# Patient Record
Sex: Male | Born: 1975
Health system: Southern US, Community
[De-identification: ages and names within clinical notes are randomized; demographics above are authoritative.]

## PROBLEM LIST (undated history)

## (undated) ENCOUNTER — Emergency Department (HOSPITAL_COMMUNITY): Payer: Self-pay | Source: Home / Self Care

## (undated) DIAGNOSIS — I38 Endocarditis, valve unspecified: Secondary | ICD-10-CM

## (undated) DIAGNOSIS — R011 Cardiac murmur, unspecified: Secondary | ICD-10-CM

## (undated) HISTORY — PX: BACK SURGERY: SHX140

---

## 2002-11-13 ENCOUNTER — Emergency Department (HOSPITAL_COMMUNITY): Admission: EM | Admit: 2002-11-13 | Discharge: 2002-11-13 | Payer: Self-pay | Admitting: *Deleted

## 2003-11-18 ENCOUNTER — Emergency Department (HOSPITAL_COMMUNITY): Admission: EM | Admit: 2003-11-18 | Discharge: 2003-11-18 | Payer: Self-pay | Admitting: Emergency Medicine

## 2011-07-06 ENCOUNTER — Emergency Department (HOSPITAL_BASED_OUTPATIENT_CLINIC_OR_DEPARTMENT_OTHER)
Admission: EM | Admit: 2011-07-06 | Discharge: 2011-07-06 | Disposition: A | Discharge status: Home / Self Care | Payer: Self-pay | Attending: Emergency Medicine | Admitting: Emergency Medicine

## 2011-07-06 DIAGNOSIS — F17200 Nicotine dependence, unspecified, uncomplicated: Secondary | ICD-10-CM | POA: Insufficient documentation

## 2011-07-06 DIAGNOSIS — H109 Unspecified conjunctivitis: Secondary | ICD-10-CM | POA: Insufficient documentation

## 2011-07-06 DIAGNOSIS — H5712 Ocular pain, left eye: Secondary | ICD-10-CM

## 2011-07-06 DIAGNOSIS — H5710 Ocular pain, unspecified eye: Secondary | ICD-10-CM | POA: Insufficient documentation

## 2011-07-06 HISTORY — DX: Endocarditis, valve unspecified: I38

## 2011-07-06 MED ORDER — TETRACAINE HCL 0.5 % OP SOLN
OPHTHALMIC | Status: AC
  Administered 2011-07-06: 2 [drp] via OPHTHALMIC
  Filled 2011-07-06: qty 2

## 2011-07-06 MED ORDER — FLUORESCEIN SODIUM 1 MG OP STRP
1.0000 | ORAL_STRIP | Freq: Once | OPHTHALMIC | Status: AC
  Administered 2011-07-06: 1 via OPHTHALMIC

## 2011-07-06 MED ORDER — HYDROCODONE-ACETAMINOPHEN 5-500 MG PO TABS
1.0000 | ORAL_TABLET | Freq: Four times a day (QID) | ORAL | Status: DC | PRN
Start: 2011-07-06 — End: 2011-07-16

## 2011-07-06 MED ORDER — TETRACAINE HCL 0.5 % OP SOLN
1.0000 [drp] | Freq: Once | OPHTHALMIC | Status: AC
  Administered 2011-07-06: 2 [drp] via OPHTHALMIC

## 2011-07-06 MED ORDER — ERYTHROMYCIN 5 MG/GM OP OINT
TOPICAL_OINTMENT | OPHTHALMIC | Status: AC
  Administered 2011-07-06: 1 via OPHTHALMIC
  Filled 2011-07-06: qty 3.5

## 2011-07-06 MED ORDER — ERYTHROMYCIN 5 MG/GM OP OINT
TOPICAL_OINTMENT | Freq: Four times a day (QID) | OPHTHALMIC | Status: DC
  Administered 2011-07-06: 1 via OPHTHALMIC

## 2011-07-06 MED ORDER — FLUORESCEIN SODIUM 1 MG OP STRP
ORAL_STRIP | OPHTHALMIC | Status: AC
  Administered 2011-07-06: 1 via OPHTHALMIC
  Filled 2011-07-06: qty 1

## 2011-07-06 NOTE — ED Notes (Signed)
Pt states that his L eye has been very red, burning, and irritated.  Unaware of any activity that may have injured his eye.  No drainage noted.

## 2011-07-06 NOTE — ED Provider Notes (Signed)
History     CSN: [id number] Arrival date & time: 07/06/2011  1:51 AM   First MD Initiated Contact with Patient 07/06/11 0209      Chief Complaint  Patient presents with  . Eye Pain    (Consider location/radiation/quality/duration/timing/severity/associated sxs/prior treatment) Patient is a 35 y.o. male presenting with eye pain. The history is provided by the patient.  Eye Pain This is a new problem. Episode onset: 4 days ago. The problem occurs constantly. The problem has been gradually worsening. Pertinent negatives include no chest pain, no abdominal pain, no headaches and no shortness of breath. Exacerbated by: Sherlynn Stalls makes it worse. The symptoms are relieved by nothing. Treatments tried: Over-the-counter saline drops with minimal relief. Also taking ibuprofen with mild relief.   location left eye. does not wear contacts. No known foreign body and has some foreign body sensation. Pain is sharp and there is associated redness to the eye. No change in vision. 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 No fevers or chills. Has some associated left-sided headache. No pain with movement of the eye.  Past Medical History  Diagnosis Date  . Heart valve disorder     History reviewed. No pertinent past surgical history.  History reviewed. No pertinent family history.  History  Substance Use Topics  . Smoking status: Current Everyday Smoker -- 0.5 packs/day for 23 years    Types: Cigarettes  . Smokeless tobacco: Not on file  . Alcohol Use: No      Review of Systems  Constitutional: Negative for fever and chills.  HENT: Negative for ear pain, sore throat, rhinorrhea, neck pain and neck stiffness.   Eyes: Positive for pain and redness. Negative for itching and visual disturbance.       Some clear drainage. No thick discharge  Respiratory: Negative for cough and shortness of breath.   Cardiovascular: Negative for chest pain.  Gastrointestinal: Negative for abdominal pain.    Genitourinary: Negative for dysuria.  Musculoskeletal: Negative for back pain.  Skin: Negative for rash.  Neurological: Negative for headaches.  All other systems reviewed and are negative.    Allergies  Lactose intolerance (gi)  Home Medications   Current Outpatient Rx  Name Route Sig Dispense Refill  . EYE IRRIGATING OP SOLN Left Eye Place 1 drop into the left eye as needed.      Marland Kitchen HYDROCODONE-ACETAMINOPHEN 5-500 MG PO TABS Oral Take 1-2 tablets by mouth every 6 (six) hours as needed for pain. 15 tablet 0    BP 111/71  Pulse 66  Temp(Src) 97.9 F (36.6 C) (Oral)  Resp 17  Ht 5\' 8"  (1.727 m)  Wt 180 lb (81.647 kg)  BMI 27.37 kg/m2  SpO2 97%  Physical Exam  Constitutional: He is oriented to person, place, and time. He appears well-developed and well-nourished.  HENT:  Head: Normocephalic and atraumatic.  Eyes: EOM are normal. Pupils are equal, round, and reactive to light. Right eye exhibits no discharge. Left eye exhibits no discharge. No scleral icterus.       Right eye no abnormalities. Pupil reactive and no pain in left eye when shining light in the right eye. Left eye conjunctiva injected. Photophobia present. I palpated mild tenderness but is soft . No periorbital swelling or erythema. No mid-fixed dilated pupil, is appropriatly reactive to light.   Neck: Trachea normal. Neck supple. No thyromegaly present.  Cardiovascular: Normal rate, regular rhythm, S1 normal, S2 normal and normal pulses.     No systolic murmur is  present   No diastolic murmur is present  Pulses:      Radial pulses are 2+ on the right side, and 2+ on the left side.  Pulmonary/Chest: Effort normal and breath sounds normal. He has no wheezes. He has no rhonchi. He has no rales. He exhibits no tenderness.  Abdominal: Soft. Normal appearance and bowel sounds are normal. There is no tenderness. There is no CVA tenderness and negative Murphy's sign.  Neurological: He is alert and oriented to person,  place, and time. He has normal strength. No cranial nerve deficit or sensory deficit. GCS eye subscore is 4. GCS verbal subscore is 5. GCS motor subscore is 6.  Skin: Skin is warm and dry. No rash noted. He is not diaphoretic.  Psychiatric: His speech is normal.       Cooperative and appropriate    ED Course  Procedures (including critical care time)  Left eye Wood's lamp and slit-lamp and tonometry exam Intraocular pressure measured 10 by Tono-pen. Tetracaine and fluorescein applied with relief of pain. No dye uptake. No ulceration present. No dendritic pattern. No foreign body seen. VAs: L eye 20/35 and R eye 20/25  1. Conjunctivitis   2. Left eye pain       MDM   Left eye pain and photophobia with conjunctival injection. Pain improved with tetracaine. No foreign body seen. Erythromycin ointment provided. Prescription for hydrocodone as needed. Given severity of pain the initial valuation and progressive symptoms, I recommended the patient seen ophthalmologist today in clinic. There is no glaucoma with normal tonometry. Symptoms improved with no vision deficits and stable for discharge at this time.        Sunnie Nielsen, MD 07/06/11 [phone]

## 2011-07-16 ENCOUNTER — Encounter (HOSPITAL_BASED_OUTPATIENT_CLINIC_OR_DEPARTMENT_OTHER): Payer: Self-pay

## 2011-07-16 ENCOUNTER — Emergency Department (HOSPITAL_BASED_OUTPATIENT_CLINIC_OR_DEPARTMENT_OTHER)
Admission: EM | Admit: 2011-07-16 | Discharge: 2011-07-16 | Disposition: A | Discharge status: Home / Self Care | Payer: Self-pay | Attending: Emergency Medicine | Admitting: Emergency Medicine

## 2011-07-16 DIAGNOSIS — S0500XA Injury of conjunctiva and corneal abrasion without foreign body, unspecified eye, initial encounter: Secondary | ICD-10-CM

## 2011-07-16 DIAGNOSIS — F17200 Nicotine dependence, unspecified, uncomplicated: Secondary | ICD-10-CM | POA: Insufficient documentation

## 2011-07-16 DIAGNOSIS — X58XXXA Exposure to other specified factors, initial encounter: Secondary | ICD-10-CM | POA: Insufficient documentation

## 2011-07-16 DIAGNOSIS — H5710 Ocular pain, unspecified eye: Secondary | ICD-10-CM | POA: Insufficient documentation

## 2011-07-16 DIAGNOSIS — S058X9A Other injuries of unspecified eye and orbit, initial encounter: Secondary | ICD-10-CM | POA: Insufficient documentation

## 2011-07-16 MED ORDER — FLUORESCEIN SODIUM 1 MG OP STRP
ORAL_STRIP | OPHTHALMIC | Status: AC
  Administered 2011-07-16: 16:00:00
  Filled 2011-07-16: qty 1

## 2011-07-16 MED ORDER — HYDROCODONE-ACETAMINOPHEN 5-500 MG PO TABS: 1.0000 | ORAL_TABLET | Freq: Four times a day (QID) | ORAL | Status: AC | PRN

## 2011-07-16 MED ORDER — CIPROFLOXACIN HCL 0.3 % OP SOLN
OPHTHALMIC | Status: AC
  Filled 2011-07-16: qty 2.5

## 2011-07-16 MED ORDER — CIPROFLOXACIN HCL 0.3 % OP SOLN
2.0000 [drp] | OPHTHALMIC | Status: DC
  Administered 2011-07-16: 2 [drp] via OPHTHALMIC

## 2011-07-16 MED ORDER — TETRACAINE HCL 0.5 % OP SOLN
OPHTHALMIC | Status: AC
  Administered 2011-07-16: 16:00:00
  Filled 2011-07-16: qty 2

## 2011-07-16 NOTE — ED Provider Notes (Signed)
History     CSN: [id number]  Arrival date & time 07/16/11  1413   First MD Initiated Contact with Patient 07/16/11 1528      Chief Complaint  Patient presents with  . Eye Pain    (Consider location/radiation/quality/duration/timing/severity/associated sxs/prior treatment) Patient is a 35 y.o. male presenting with eye pain. The history is provided by the patient.  Eye Pain This is a recurrent problem. The current episode started more than 1 week ago. The problem occurs constantly. The problem has not changed since onset.Pertinent negatives include no chest pain and no headaches.   patient's had left eye pain for last couple weeks. Some redness. He was seen here just over a week ago for the same. He is given a prescription for the symptoms are not improved. He states that he did not followup with ophthalmology, he came back here to see if there is something in there. He states he has trouble seeing out of that eye. He states that has gotten worse. No fevers. No trauma. No headache. He states the C-spine on the other eye.  Past Medical History  Diagnosis Date  . Heart valve disorder     History reviewed. No pertinent past surgical history.  History reviewed. No pertinent family history.  History  Substance Use Topics  . Smoking status: Current Everyday Smoker -- 0.5 packs/day for 23 years    Types: Cigarettes  . Smokeless tobacco: Not on file  . Alcohol Use: No      Review of Systems  Constitutional: Negative for chills and fatigue.  Eyes: Positive for photophobia, pain, discharge, redness and visual disturbance. Negative for itching.  Respiratory: Negative for cough.   Cardiovascular: Negative for chest pain.  Neurological: Negative for headaches.    Allergies  Lactose intolerance (gi)  Home Medications   Current Outpatient Rx  Name Route Sig Dispense Refill  . EYE IRRIGATING OP SOLN Left Eye Place 1 drop into the left eye as needed.      Marland Kitchen  HYDROCODONE-ACETAMINOPHEN 5-500 MG PO TABS Oral Take 1-2 tablets by mouth every 6 (six) hours as needed for pain. 15 tablet 0    BP 121/75  Pulse 80  Temp(Src) 98.7 F (37.1 C) (Oral)  Resp 16  Ht 5\' 8"  (1.727 m)  Wt 180 lb (81.647 kg)  BMI 27.37 kg/m2  SpO2 100%  Physical Exam  Constitutional: He appears well-developed and well-nourished.  HENT:  Head: Normocephalic.       Left eye conjunctiva injected. No real drainage. Cornea slightly cloudy. Intraocular pressure 14. Under dyed examination has uptake inferiorly with some linear aspects. No foreign body seen. Some photophobia.  Neck: Normal range of motion.    ED Course  Procedures (including critical care time)  Labs Reviewed - No data to display No results found.   1. Corneal abrasion       MDM  Patient's had eye pain the last couple weeks. Redness. Seen in ER couple days ago for the same he never followed with ophthalmology. He has fluorescin uptake inferiorly. He'll follow with Dr. Delaney Meigs tomorrow 1 PM he was given antibiotic drops.        Juliet Rude. Rubin Payor, MD 07/16/11 [phone]

## 2011-07-16 NOTE — ED Notes (Signed)
MD at bedside. 

## 2011-07-16 NOTE — ED Notes (Signed)
Pt c/o L eye pain for past 2.5 weeks.  Pt has redness.  Pt seen here 12/17 for same. Given Rx for abrasion but SX are worsening.

## 2015-11-25 ENCOUNTER — Emergency Department (HOSPITAL_BASED_OUTPATIENT_CLINIC_OR_DEPARTMENT_OTHER): Payer: Self-pay

## 2015-11-25 ENCOUNTER — Encounter (HOSPITAL_BASED_OUTPATIENT_CLINIC_OR_DEPARTMENT_OTHER): Payer: Self-pay | Admitting: *Deleted

## 2015-11-25 ENCOUNTER — Emergency Department (HOSPITAL_BASED_OUTPATIENT_CLINIC_OR_DEPARTMENT_OTHER)
Admission: EM | Admit: 2015-11-25 | Discharge: 2015-11-25 | Disposition: A | Discharge status: Home / Self Care | Payer: Self-pay | Attending: Emergency Medicine | Admitting: Emergency Medicine

## 2015-11-25 DIAGNOSIS — N50812 Left testicular pain: Secondary | ICD-10-CM | POA: Insufficient documentation

## 2015-11-25 DIAGNOSIS — Z87891 Personal history of nicotine dependence: Secondary | ICD-10-CM | POA: Insufficient documentation

## 2015-11-25 DIAGNOSIS — N50819 Testicular pain, unspecified: Secondary | ICD-10-CM

## 2015-11-25 LAB — URINALYSIS, ROUTINE W REFLEX MICROSCOPIC
Glucose, UA: NEGATIVE mg/dL
Hgb urine dipstick: NEGATIVE
Ketones, ur: NEGATIVE mg/dL
LEUKOCYTES UA: NEGATIVE
NITRITE: NEGATIVE
PH: 6 (ref 5.0–8.0)
Protein, ur: NEGATIVE mg/dL
SPECIFIC GRAVITY, URINE: 1.035 — AB (ref 1.005–1.030)

## 2015-11-25 NOTE — ED Provider Notes (Signed)
CSN: [id number]     Arrival date & time 11/25/15  1711 History  By signing my name below, I, Linus Galas, attest that this documentation has been prepared under the direction and in the presence of Geoffery Lyons, MD. Electronically Signed: Linus Galas, ED Scribe. 11/25/2015. 5:56 PM.   Chief Complaint  Patient presents with  . Groin Injury   The history is provided by the patient. No language interpreter was used.   HPI Comments: Cameron Johns is a 40 y.o. male who presents to the Emergency Department complaining of groin pain that began today. Pt states he was getting out of bed yesterday when he stepped on a bag of laundry causing him to do a split.  Pt states he did not have pain immediately afterwards however reports he began having excruciating pain since 4 AM this morning. Since then, he notes his left testicle is swollen and hanging lower that the right testicle. He also reports shooting pain down his left leg. Pt denies any vomiting, hematuria, or any other symptoms at this time.   Past Medical History  Diagnosis Date  . Heart valve disorder    History reviewed. No pertinent past surgical history. No family history on file. Social History  Substance Use Topics  . Smoking status: Former Smoker -- 0.50 packs/day for 23 years    Types: Cigarettes  . Smokeless tobacco: None  . Alcohol Use: No    Review of Systems A complete 10 system review of systems was obtained and all systems are negative except as noted in the HPI and PMH.   Allergies  Lactose intolerance (gi)  Home Medications   Prior to Admission medications   Medication Sig Start Date End Date Taking? Authorizing Provider  ophthalmic irrigation solution ophthalmic solution Place 1 drop into the left eye as needed.      Historical Provider, MD   BP 122/87 mmHg  Pulse 84  Temp(Src) 98.3 F (36.8 C) (Oral)  Resp 18  Ht  (1.727 m)  Wt 200 lb (90.719 kg)  BMI 30.42 kg/m2  SpO2 99%   Physical Exam   Constitutional: He is oriented to person, place, and time. He appears well-developed and well-nourished.  HENT:  Head: Normocephalic and atraumatic.  Cardiovascular: Normal rate.   Pulmonary/Chest: Effort normal.  Genitourinary:  Left testicle appears grossly normal. TTP and the testicle is freely mobile within the scrotum. No palpable inguinal hernia.   Neurological: He is alert and oriented to person, place, and time.  Skin: Skin is warm and dry.  Psychiatric: He has a normal mood and affect.  Nursing note and vitals reviewed.   ED Course  Procedures  DIAGNOSTIC STUDIES: Oxygen Saturation is 99% on room air, normal by my interpretation.    COORDINATION OF CARE: 5:50 PM Will order US scrotum and urinalysis. Discussed treatment plan with pt at bedside and pt agreed to plan.  Labs Review Labs Reviewed  URINALYSIS, ROUTINE W REFLEX MICROSCOPIC (NOT AT [facility])    Imaging Review No results found. I have personally reviewed and evaluated these images and lab results as part of my medical decision-making.    MDM   Final diagnoses:  None    Patient presents with complaints of pain in his testicle. He states that he slipped and fell yesterday and has been hurting since. His physical examination is unremarkable. He does not examine like a torsion and his ultrasound reveals good blood flow. There are no alternate diagnosis suggested by the ultrasound.  He will be discharged with anti-inflammatories, rest, and when necessary return.  I personally performed the services described in this documentation, which was scribed in my presence. The recorded information has been reviewed and is accurate.       Geoffery Lyonsouglas Laddie Naeem, MD 11/25/15 2108

## 2015-11-25 NOTE — ED Notes (Signed)
Asked Pt. For urine sample and pt. Reports he is unable to urinate at this time.

## 2015-11-25 NOTE — Discharge Instructions (Signed)
Ibuprofen 600 mg every 6 hours as needed for pain.  Follow-up with your primary Dr. if not improving in the next 3-4 days, and return to the ER if symptoms significantly worsen or change.

## 2015-11-25 NOTE — ED Notes (Addendum)
He slipped while walking last night. Woke this am with pain in his left groin. Swelling to his testicle.

## 2015-11-25 NOTE — ED Notes (Signed)
Pt. Reports he has no trouble urinating and no difficulty feeling sensation in his mid section.  Pt. Does report pain in the L upper leg and L testicle

## 2017-03-10 ENCOUNTER — Encounter (HOSPITAL_BASED_OUTPATIENT_CLINIC_OR_DEPARTMENT_OTHER): Payer: Self-pay

## 2017-03-10 ENCOUNTER — Emergency Department (HOSPITAL_BASED_OUTPATIENT_CLINIC_OR_DEPARTMENT_OTHER)
Admission: EM | Admit: 2017-03-10 | Discharge: 2017-03-10 | Disposition: A | Discharge status: Home / Self Care | Payer: 59 | Attending: Emergency Medicine | Admitting: Emergency Medicine

## 2017-03-10 ENCOUNTER — Emergency Department (HOSPITAL_BASED_OUTPATIENT_CLINIC_OR_DEPARTMENT_OTHER): Payer: 59

## 2017-03-10 DIAGNOSIS — R0789 Other chest pain: Secondary | ICD-10-CM | POA: Insufficient documentation

## 2017-03-10 DIAGNOSIS — F17210 Nicotine dependence, cigarettes, uncomplicated: Secondary | ICD-10-CM | POA: Diagnosis not present

## 2017-03-10 DIAGNOSIS — R079 Chest pain, unspecified: Secondary | ICD-10-CM | POA: Diagnosis present

## 2017-03-10 HISTORY — DX: Cardiac murmur, unspecified: R01.1

## 2017-03-10 LAB — CBC
HEMATOCRIT: 46 % (ref 39.0–52.0)
Hemoglobin: 16.3 g/dL (ref 13.0–17.0)
MCH: 31.5 pg (ref 26.0–34.0)
MCHC: 35.4 g/dL (ref 30.0–36.0)
MCV: 88.8 fL (ref 78.0–100.0)
PLATELETS: 212 10*3/uL (ref 150–400)
RBC: 5.18 MIL/uL (ref 4.22–5.81)
RDW: 13.3 % (ref 11.5–15.5)
WBC: 7.3 10*3/uL (ref 4.0–10.5)

## 2017-03-10 LAB — BASIC METABOLIC PANEL
Anion gap: 9 (ref 5–15)
BUN: 18 mg/dL (ref 6–20)
CO2: 25 mmol/L (ref 22–32)
Calcium: 9.3 mg/dL (ref 8.9–10.3)
Chloride: 103 mmol/L (ref 101–111)
Creatinine, Ser: 1.03 mg/dL (ref 0.61–1.24)
Glucose, Bld: 97 mg/dL (ref 65–99)
POTASSIUM: 4 mmol/L (ref 3.5–5.1)
SODIUM: 137 mmol/L (ref 135–145)

## 2017-03-10 LAB — TROPONIN I: Troponin I: <0.03 ng/mL (ref <0.03)

## 2017-03-10 NOTE — ED Notes (Signed)
Patient transported to X-ray 

## 2017-03-10 NOTE — Discharge Instructions (Signed)
Follow up with a pcp.  Try to quit smoking

## 2017-03-10 NOTE — ED Provider Notes (Signed)
[facility] Provider Note   CSN: [id number] Arrival date & time: 03/10/17  1202     History   Chief Complaint Chief Complaint  Patient presents with  . Chest Pain    HPI Cameron Johns is a 41 y.o. male.  41 yo M with a chief complaint of chest pain. This been an ongoing issue for him. The patient has had episodes of sharp shooting chest pain just underneath the nipple on the left side. Usually last for seconds sometimes is associated with a syncopal event. Mostly occur when he is under increased stress. Denies cough or congestion denies fevers or chills. Denies lower extremity edema. Denies hemoptysis. Denies prior PE or DVT. Denies recent travel or surgery. States that his initial episode occurred when he was divorcing his first wife. His sister told him that he had had a heart attack based clinically on his symptoms. He never saw a physician. Since then he has continued to have symptoms like this off and on sometimes associated with a syncopal event. He has had one exertional syncopal event that occurred about 6 months ago. Otherwise they typically occur at rest. He had a sharp chest pain yesterday with some shortness of breath that lasted for about a minute and resolved. his girlfriend talked him into coming to the ED.   The history is provided by the patient.  Chest Pain   This is a new problem. The current episode started more than 1 week ago. The problem occurs constantly. The problem has not changed since onset.The pain is present in the lateral region. The pain is at a severity of 10/10. The pain is severe. The quality of the pain is described as sharp. The pain does not radiate. Duration of episode(s) is 48 months. Associated symptoms include shortness of breath. Pertinent negatives include no abdominal pain, no fever, no headaches, no palpitations and no vomiting. He has tried nothing for the symptoms. The treatment provided no relief. Risk factors include  smoking/tobacco exposure.  Pertinent negatives for past medical history include no DVT, no MI and no PVD.  Pertinent negatives for family medical history include: no early MI and no PE.  Procedure history is negative for cardiac catheterization.    Past Medical History:  Diagnosis Date  . Heart murmur   . Heart valve disorder     There are no active problems to display for this patient.   History reviewed. No pertinent surgical history.     Home Medications    Prior to Admission medications   Not on File    Family History No family history on file.  Social History Social History  Substance Use Topics  . Smoking status: Current Every Day Smoker    Packs/day: 0.50    Years: 23.00    Types: Cigarettes  . Smokeless tobacco: Never Used  . Alcohol use Yes     Comment: occ     Allergies   Lactose intolerance (gi)   Review of Systems Review of Systems  Constitutional: Negative for chills and fever.  HENT: Negative for congestion and facial swelling.   Eyes: Negative for discharge and visual disturbance.  Respiratory: Positive for shortness of breath.   Cardiovascular: Positive for chest pain. Negative for palpitations.  Gastrointestinal: Negative for abdominal pain, diarrhea and vomiting.  Musculoskeletal: Negative for arthralgias and myalgias.  Skin: Negative for color change and rash.  Neurological: Positive for syncope. Negative for tremors and headaches.  Psychiatric/Behavioral: Negative for confusion and dysphoric mood.  Physical Exam Updated Vital Signs BP (!) 126/95 (BP Location: Left Arm)   Pulse 74   Temp 98.1 F (36.7 C) (Oral)   Resp 18   Ht 5\' 6"  (1.676 m)   Wt 80.6 kg (177 lb 11.1 oz)   SpO2 97%   BMI 28.68 kg/m   Physical Exam  Constitutional: He is oriented to person, place, and time. He appears well-developed and well-nourished.  HENT:  Head: Normocephalic and atraumatic.  Eyes: Pupils are equal, round, and reactive to light.  EOM are normal.  Neck: Normal range of motion. Neck supple. No JVD present.  Cardiovascular: Normal rate and regular rhythm.  Exam reveals no gallop and no friction rub.   No murmur heard. Pulmonary/Chest: No respiratory distress. He has no wheezes. He exhibits tenderness (pinpoint chest underneath the left nipple. Reproduces his pain.Marland Kitchen).  Abdominal: He exhibits no distension and no mass. There is no tenderness. There is no rebound and no guarding.  Musculoskeletal: Normal range of motion.  Neurological: He is alert and oriented to person, place, and time.  Skin: No rash noted. No pallor.  Psychiatric: He has a normal mood and affect. His behavior is normal.  Nursing note and vitals reviewed.    ED Treatments / Results  Labs (all labs ordered are listed, but only abnormal results are displayed) Labs Reviewed  BASIC METABOLIC PANEL  CBC  TROPONIN I    EKG  EKG Interpretation  Date/Time:  Wednesday March 10 2017 12:09:07 EDT Ventricular Rate:  77 PR Interval:  142 QRS Duration: 86 QT Interval:  368 QTC Calculation: 416 R Axis:   79 Text Interpretation:  Normal sinus rhythm Early repolarization Normal ECG ? peaked t waves though more likely BER with diffuse lead involvement Otherwise no significant change Confirmed by Melene Plan [phone]) on 03/10/2017 12:22:17 PM       Radiology Dg Chest 2 View  Result Date: 03/10/2017 CLINICAL DATA:  Intermittent chest pain with multiple syncopal episodes over the past year. Chest pain became worse yesterday. History of heart murmur, current smoker. EXAM: CHEST  2 VIEW COMPARISON:  Report of a chest x-ray dated January 10, 2000. FINDINGS: The lungs are well-expanded. The interstitial markings are coarse. There is no alveolar infiltrate, pleural effusion, or pneumothorax. The heart and pulmonary vascularity are normal. The mediastinum is normal in width. The bony thorax exhibits no acute abnormality. IMPRESSION: Mild bronchitic-smoking related  interstitial changes. No pneumonia, CHF, nor other acute cardiopulmonary abnormality. Electronically Signed   By: David  [place] M.D.   On: 03/10/2017 12:52    Procedures Procedures (including critical care time)  Medications Ordered in ED Medications - No data to display   Initial Impression / Assessment and Plan / ED Course  I have reviewed the triage vital signs and the nursing notes.  Pertinent labs & imaging results that were available during my care of the patient were reviewed by me and considered in my medical decision making (see chart for details).     41 yo M With a chief complaint of atypical chest pain. Reproduced on exam. He also has had multiple syncopal events in the past though they sound mostly vasovagal in nature. He had chest pain transiently yesterday. No pain today. Workup ordered in triage. I feel completely atypical for ACS. He does have a smoking history and I discussed him trying to quit.  Workup negative. Discharge home.  Discussed smoking cessation with patient and was they were offerred resources to help stop.  Total  time was 5 min CPT code 16109.   1:26 PM:  I have discussed the diagnosis/risks/treatment options with the patient and believe the pt to be eligible for discharge home to follow-up with PCP. We also discussed returning to the ED immediately if new or worsening sx occur. We discussed the sx which are most concerning (e.g., sudden worsening pain, fever, inability to tolerate by mouth) that necessitate immediate return. Medications administered to the patient during their visit and any new prescriptions provided to the patient are listed below.  Medications given during this visit Medications - No data to display   The patient appears reasonably screen and/or stabilized for discharge and I doubt any other medical condition or other [facility] requiring further screening, evaluation, or treatment in the ED at this time prior to discharge.    Final  Clinical Impressions(s) / ED Diagnoses   Final diagnoses:  Chest wall pain    New Prescriptions New Prescriptions   No medications on file     Melene Plan, DO 03/10/17 1326

## 2017-03-10 NOTE — ED Triage Notes (Signed)
C/o intermittent CP with multiple syncopal episodes x 1 year-NAD-steady gait-pt was seen at [facility] and sent to ED

## 2017-03-10 NOTE — ED Notes (Signed)
ED Provider at bedside. 

## 2017-08-17 ENCOUNTER — Emergency Department (HOSPITAL_BASED_OUTPATIENT_CLINIC_OR_DEPARTMENT_OTHER): Payer: 59

## 2017-08-17 ENCOUNTER — Emergency Department (HOSPITAL_BASED_OUTPATIENT_CLINIC_OR_DEPARTMENT_OTHER)
Admission: EM | Admit: 2017-08-17 | Discharge: 2017-08-18 | Disposition: A | Discharge status: Home / Self Care | Payer: 59 | Attending: Emergency Medicine | Admitting: Emergency Medicine

## 2017-08-17 ENCOUNTER — Other Ambulatory Visit: Payer: Self-pay

## 2017-08-17 ENCOUNTER — Encounter (HOSPITAL_BASED_OUTPATIENT_CLINIC_OR_DEPARTMENT_OTHER): Payer: Self-pay | Admitting: *Deleted

## 2017-08-17 DIAGNOSIS — F17210 Nicotine dependence, cigarettes, uncomplicated: Secondary | ICD-10-CM | POA: Insufficient documentation

## 2017-08-17 DIAGNOSIS — Y9301 Activity, walking, marching and hiking: Secondary | ICD-10-CM | POA: Diagnosis not present

## 2017-08-17 DIAGNOSIS — Y999 Unspecified external cause status: Secondary | ICD-10-CM | POA: Insufficient documentation

## 2017-08-17 DIAGNOSIS — W0110XA Fall on same level from slipping, tripping and stumbling with subsequent striking against unspecified object, initial encounter: Secondary | ICD-10-CM | POA: Diagnosis not present

## 2017-08-17 DIAGNOSIS — M62830 Muscle spasm of back: Secondary | ICD-10-CM | POA: Diagnosis not present

## 2017-08-17 DIAGNOSIS — W19XXXA Unspecified fall, initial encounter: Secondary | ICD-10-CM

## 2017-08-17 DIAGNOSIS — M25561 Pain in right knee: Secondary | ICD-10-CM | POA: Insufficient documentation

## 2017-08-17 DIAGNOSIS — Y929 Unspecified place or not applicable: Secondary | ICD-10-CM | POA: Insufficient documentation

## 2017-08-17 DIAGNOSIS — M62838 Other muscle spasm: Secondary | ICD-10-CM

## 2017-08-17 MED ORDER — IBUPROFEN 400 MG PO TABS
400.0000 mg | ORAL_TABLET | Freq: Once | ORAL | Status: AC
Start: 2017-08-17 — End: 2017-08-17
  Administered 2017-08-17: 400 mg via ORAL
  Filled 2017-08-17: qty 1

## 2017-08-17 MED ORDER — OXYCODONE-ACETAMINOPHEN 5-325 MG PO TABS: 1.0000 | ORAL_TABLET | ORAL | 0 refills | Status: AC | PRN

## 2017-08-17 MED ORDER — CYCLOBENZAPRINE HCL 10 MG PO TABS: 10.0000 mg | ORAL_TABLET | Freq: Two times a day (BID) | ORAL | 0 refills | Status: AC | PRN

## 2017-08-17 MED ORDER — OXYCODONE-ACETAMINOPHEN 5-325 MG PO TABS
1.0000 | ORAL_TABLET | Freq: Once | ORAL | Status: AC
  Administered 2017-08-17: 1 via ORAL
  Filled 2017-08-17: qty 1

## 2017-08-17 NOTE — ED Notes (Signed)
Pt denies pain radiating down back. Denies incontinence or lower extremity weakness.

## 2017-08-17 NOTE — ED Triage Notes (Signed)
Fall tonight when his 100 pound dog pulled him down. Injury to his lower back and right knee.

## 2017-08-17 NOTE — Discharge Instructions (Signed)
Your x-rays today showed no evidence of acute fracture in your knee.  You may have soft tissue or ligamentous injuries that you need to follow-up with orthopedics to further evaluate.  Your low back pain is likely due to muscle spasm.  Your x-rays were reassuring of your back.  Please follow-up with the orthopedist and use the pain and muscle relaxant medicines to help with your symptoms.  Please use the crutches.  If any symptoms change or worsen, please return to the nearest emergency department.

## 2017-08-17 NOTE — ED Provider Notes (Signed)
[facility] EMERGENCY DEPARTMENT Provider Note   CSN: [id number] Arrival date & time: 08/17/17  2007     History   Chief Complaint Chief Complaint  Patient presents with  . Fall    HPI Cameron Johns is a 42 y.o. male.  The history is provided by the patient and medical records.  Fall  This is a new problem. The current episode started 3 to 5 hours ago. The problem occurs constantly. The problem has not changed since onset.Pertinent negatives include no chest pain, no abdominal pain, no headaches and no shortness of breath. The symptoms are aggravated by standing and walking. Nothing relieves the symptoms. He has tried nothing for the symptoms. The treatment provided no relief.    Past Medical History:  Diagnosis Date  . Heart murmur   . Heart valve disorder     There are no active problems to display for this patient.   Past Surgical History:  Procedure Laterality Date  . BACK SURGERY         Home Medications    Prior to Admission medications   Medication Sig Start Date End Date Taking? Authorizing Provider  QUEtiapine Fumarate (SEROQUEL PO) Take by mouth.   Yes [provider]    Family History No family history on file.  Social History Social History   Tobacco Use  . Smoking status: Current Every Day Smoker    Packs/day: 0.50    Years: 23.00    Pack years: 11.50    Types: Cigarettes  . Smokeless tobacco: Never Used  Substance Use Topics  . Alcohol use: Yes    Comment: occ  . Drug use: No     Allergies   Lactose intolerance (gi)   Review of Systems Review of Systems  Constitutional: Negative for chills, diaphoresis and fatigue.  HENT: Negative for congestion.   Respiratory: Negative for shortness of breath.   Cardiovascular: Negative for chest pain.  Gastrointestinal: Negative for abdominal pain, diarrhea, nausea and vomiting.  Genitourinary: Negative for flank pain.  Musculoskeletal: Positive for back pain. Negative  for neck pain and neck stiffness.  Skin: Negative for rash and wound.  Neurological: Negative for headaches.  Psychiatric/Behavioral: Negative for agitation.  All other systems reviewed and are negative.    Physical Exam Updated Vital Signs BP (!) 126/92   Pulse 94   Temp 98.5 F (36.9 C) (Oral)   Resp 18   Ht 5\' 6"  (1.676 m)   Wt 79.4 kg (175 lb)   SpO2 98%   BMI 28.25 kg/m   Physical Exam  Constitutional: He is oriented to person, place, and time. He appears well-developed and well-nourished. No distress.  HENT:  Head: Normocephalic and atraumatic.  Right Ear: External ear normal.  Left Ear: External ear normal.  Nose: Nose normal.  Mouth/Throat: Oropharynx is clear and moist. No oropharyngeal exudate.  Eyes: Conjunctivae and EOM are normal. Pupils are equal, round, and reactive to light.  Neck: Normal range of motion. Neck supple.  Cardiovascular: Normal rate.  No murmur heard. Pulmonary/Chest: Effort normal. No stridor. No respiratory distress.  Abdominal: Soft. There is no tenderness. There is no rebound and no guarding.  Musculoskeletal: He exhibits tenderness. He exhibits no edema.       Right knee: He exhibits decreased range of motion. He exhibits no deformity, no laceration, no erythema and normal patellar mobility. Tenderness found. Medial joint line tenderness noted.       Lumbar back: He exhibits tenderness, pain and  spasm.       Back:       Legs: Neurological: He is alert and oriented to person, place, and time. He displays normal reflexes. No cranial nerve deficit or sensory deficit. He exhibits normal muscle tone. Coordination normal.  Skin: Skin is warm. Capillary refill takes less than 2 seconds. No rash noted. He is not diaphoretic. No erythema.  Psychiatric: He has a normal mood and affect.  Nursing note and vitals reviewed.    ED Treatments / Results  Labs (all labs ordered are listed, but only abnormal results are displayed) Labs Reviewed - No  data to display  EKG  EKG Interpretation None       Radiology Dg Lumbar Spine Complete  Result Date: 08/17/2017 CLINICAL DATA:  Fall.  Low back pain. EXAM: LUMBAR SPINE - COMPLETE 4+ VIEW COMPARISON:  None. FINDINGS: There is no evidence of lumbar spine fracture. Alignment is normal. Intervertebral disc spaces are maintained. IMPRESSION: Negative. Electronically Signed   By: Charlett Nose M.D.   On: 08/17/2017 23:26   Dg Knee Complete 4 Views Right  Result Date: 08/17/2017 CLINICAL DATA:  Right knee injury, pain. EXAM: RIGHT KNEE - COMPLETE 4+ VIEW COMPARISON:  None. FINDINGS: No evidence of fracture, dislocation, or joint effusion. No evidence of arthropathy or other focal bone abnormality. Soft tissues are unremarkable. IMPRESSION: Negative. Electronically Signed   By: Bary Richard M.D.   On: 08/17/2017 20:46    Procedures Procedures (including critical care time)  Medications Ordered in ED Medications  ibuprofen (ADVIL,MOTRIN) tablet 400 mg (400 mg Oral Given 08/17/17 2024)  oxyCODONE-acetaminophen (PERCOCET/ROXICET) 5-325 MG per tablet 1 tablet (1 tablet Oral Given 08/17/17 2324)     Initial Impression / Assessment and Plan / ED Course  I have reviewed the triage vital signs and the nursing notes.  Pertinent labs & imaging results that were available during my care of the patient were reviewed by me and considered in my medical decision making (see chart for details).     Cameron Johns is a 42 y.o. male with no significant past medical history who presents with low back pain and knee pain after a fall.  Patient reports that he has a very large dog that jerked him when he was the leash.  Patient went airborne and landed on his back and knee.  Patient reports immediate onset of pain in his medial right knee and his low back.  He denies radiation of the pain from his back.  He denies loss of bowel or bladder control.  He denies headache, loss of consciousness, or other areas of  pain.  He describes his pain as moderate to severe on arrival.  He reports pain with ambulation but has been able to hobble to the ED.  He denies numbness, tingling, or weakness but has significant pain with knee movement on the right.  On exam, patient has tenderness across his low back.  There is palpable muscle spasms.  Patient had no neurovascular deficits on lower extremities and had normal pulses.  Patient was however found to have tenderness on the medial area of the knee.  Normal sensation, strength, and pulses.  Patient had pain with name.  Taken back near x-rays reassuring however suspect soft tissue or ligamentous injury of the knee.  Patient placed in the immobilizer and crutches provided.  Patient felt somewhat better with pain pill.  Suspect muscle strain/spasm in his back.  Patient given prescription for pain medicine and muscle relaxant  and will follow up with orthopedics.  Patient had no other questions or concerns and understood return precautions.  No red flags on exam for spinal injury.  Patient felt stable for discharge home.  Patient discharged in good condition.     Final Clinical Impressions(s) / ED Diagnoses   Final diagnoses:  Fall, initial encounter  Muscle spasm  Acute pain of right knee    ED Discharge Orders        Ordered    oxyCODONE-acetaminophen (PERCOCET/ROXICET) 5-325 MG tablet  Every 4 hours PRN     08/17/17 2344    cyclobenzaprine (FLEXERIL) 10 MG tablet  2 times daily PRN     08/17/17 2344     Clinical Impression: 1. Fall, initial encounter   2. Muscle spasm   3. Acute pain of right knee     Disposition: Discharge  Condition: Good  I have discussed the results, Dx and Tx plan with the pt(& family if present). He/she/they expressed understanding and agree(s) with the plan. Discharge instructions discussed at great length. Strict return precautions discussed and pt &/or family have verbalized understanding of the instructions. No further  questions at time of discharge.    Discharge Medication List as of 08/17/2017 11:45 PM    START taking these medications   Details  cyclobenzaprine (FLEXERIL) 10 MG tablet Take 1 tablet (10 mg total) by mouth 2 (two) times daily as needed for muscle spasms., Starting Tue 08/17/2017, Print    oxyCODONE-acetaminophen (PERCOCET/ROXICET) 5-325 MG tablet Take 1 tablet by mouth every 4 (four) hours as needed for severe pain., Starting Tue 08/17/2017, Print        Follow Up: [address] [facility] [address] [place] [phone] Schedule an appointment as soon as possible for a visit    Yolonda KidaRogers, Jason Patrick, MD [address] [place] [id number] [phone]  Schedule an appointment as soon as possible for a visit       Tegeler, Canary Brimhristopher J, MD 08/18/17 [phone]

## 2017-08-18 NOTE — ED Notes (Signed)
Pt teaching provided on medications that may cause drowsiness. Pt instructed not to drive or operate heavy machinery while taking the prescribed medication. Pt verbalized understanding.   

## 2018-09-06 ENCOUNTER — Emergency Department (HOSPITAL_BASED_OUTPATIENT_CLINIC_OR_DEPARTMENT_OTHER)
Admission: EM | Admit: 2018-09-06 | Discharge: 2018-09-06 | Disposition: A | Discharge status: Home / Self Care | Payer: 59 | Attending: Emergency Medicine | Admitting: Emergency Medicine

## 2018-09-06 ENCOUNTER — Other Ambulatory Visit: Payer: Self-pay

## 2018-09-06 ENCOUNTER — Emergency Department (HOSPITAL_BASED_OUTPATIENT_CLINIC_OR_DEPARTMENT_OTHER): Payer: 59

## 2018-09-06 ENCOUNTER — Encounter (HOSPITAL_BASED_OUTPATIENT_CLINIC_OR_DEPARTMENT_OTHER): Payer: Self-pay | Admitting: Emergency Medicine

## 2018-09-06 DIAGNOSIS — J040 Acute laryngitis: Secondary | ICD-10-CM | POA: Diagnosis not present

## 2018-09-06 DIAGNOSIS — F17210 Nicotine dependence, cigarettes, uncomplicated: Secondary | ICD-10-CM | POA: Insufficient documentation

## 2018-09-06 DIAGNOSIS — R05 Cough: Secondary | ICD-10-CM

## 2018-09-06 DIAGNOSIS — J060 Acute laryngopharyngitis: Secondary | ICD-10-CM

## 2018-09-06 DIAGNOSIS — R059 Cough, unspecified: Secondary | ICD-10-CM

## 2018-09-06 MED ORDER — GUAIFENESIN-DM 100-10 MG/5ML PO SYRP: 15.0000 mL | ORAL_SOLUTION | Freq: Four times a day (QID) | ORAL | 0 refills | Status: AC | PRN

## 2018-09-06 MED ORDER — LORATADINE 10 MG PO TABS: 10.0000 mg | ORAL_TABLET | Freq: Every day | ORAL | 1 refills | Status: AC

## 2018-09-06 MED FILL — LORATADINE 10 MG TABLET: 10 | 100 days supply | Qty: 100 | Fill #0

## 2018-09-06 MED FILL — SM TUSSIN DM SYRUP: 100-10 | 2 days supply | Qty: 118 | Fill #0

## 2018-09-06 NOTE — ED Triage Notes (Signed)
Pt having persistent cough for the past 3 weeks not getting better and having sore throat from coughing.

## 2018-09-06 NOTE — ED Provider Notes (Signed)
[facility] EMERGENCY DEPARTMENT Provider Note   CSN: [id number] Arrival date & time: 09/06/18  9150    History   Chief Complaint Chief Complaint  Patient presents with  . Cough    HPI Cameron Johns is a 43 y.o. male.     43yo M w/ PMH below who p/w cough. 3 weeks ago, he had URI illness with cough, runny nose, fevers, and sore throat. His wife then got sick with similar symptoms. Most of his symptoms resolved but he reports persistent cough x 3 weeks, no improvement. Associated sore throat due to coughing. Cough is worse in the morning; he coughs up a lot of phlegm. He reports some associated hoarseness. No recent fevers and no recent travel. No therapies tried PTA. He denies any significant heartburn sx. He smokes at least 2 PPD.  The history is provided by the patient.  Cough    Past Medical History:  Diagnosis Date  . Heart murmur   . Heart valve disorder     There are no active problems to display for this patient.   Past Surgical History:  Procedure Laterality Date  . BACK SURGERY          Home Medications    Prior to Admission medications   Medication Sig Start Date End Date Taking? Authorizing Provider  cyclobenzaprine (FLEXERIL) 10 MG tablet Take 1 tablet (10 mg total) by mouth 2 (two) times daily as needed for muscle spasms. 08/17/17   Tegeler, Canary Brim, MD  guaiFENesin-dextromethorphan (ROBITUSSIN DM) 100-10 MG/5ML syrup Take 15 mLs by mouth every 6 (six) hours as needed for cough. 09/06/18   Sathvik Tiedt, Ambrose Finland, MD  loratadine (CLARITIN) 10 MG tablet Take 1 tablet (10 mg total) by mouth daily. 09/06/18   Kemet Nijjar, Ambrose Finland, MD  oxyCODONE-acetaminophen (PERCOCET/ROXICET) 5-325 MG tablet Take 1 tablet by mouth every 4 (four) hours as needed for severe pain. 08/17/17   Tegeler, Canary Brim, MD  QUEtiapine Fumarate (SEROQUEL PO) Take by mouth.    [provider]    Family History No family history on file.  Social  History Social History   Tobacco Use  . Smoking status: Current Every Day Smoker    Packs/day: 0.50    Years: 23.00    Pack years: 11.50    Types: Cigarettes  . Smokeless tobacco: Never Used  Substance Use Topics  . Alcohol use: Yes    Comment: occ  . Drug use: No     Allergies   Lactose intolerance (gi)   Review of Systems Review of Systems  Respiratory: Positive for cough.    All other systems reviewed and are negative except that which was mentioned in HPI   Physical Exam Updated Vital Signs BP 120/80   Pulse 76   Temp 97.8 F (36.6 C) (Oral)   Resp 18   Ht 5\' 7"  (1.702 m)   Wt 81.6 kg   SpO2 97%   BMI 28.19 kg/m   Physical Exam Vitals signs and nursing note reviewed.  Constitutional:      General: He is not in acute distress.    Appearance: He is well-developed.  HENT:     Head: Normocephalic and atraumatic.     Nose: Congestion present.     Mouth/Throat:     Mouth: Mucous membranes are moist.     Comments: Erythema of posterior oropharynx without exudates Eyes:     Conjunctiva/sclera: Conjunctivae normal.  Neck:     Musculoskeletal: Neck supple.  Cardiovascular:     Rate and Rhythm: Normal rate and regular rhythm.     Heart sounds: Normal heart sounds. No murmur.  Pulmonary:     Effort: Pulmonary effort is normal.     Breath sounds: Normal breath sounds. No wheezing.  Abdominal:     General: Bowel sounds are normal. There is no distension.     Palpations: Abdomen is soft.     Tenderness: There is no abdominal tenderness.  Skin:    General: Skin is warm and dry.  Neurological:     Mental Status: He is alert and oriented to person, place, and time.     Comments: Fluent speech  Psychiatric:        Judgment: Judgment normal.      ED Treatments / Results  Labs (all labs ordered are listed, but only abnormal results are displayed) Labs Reviewed - No data to display  EKG None  Radiology Dg Chest 2 View  Result Date:  09/06/2018 CLINICAL DATA:  Shortness of breath. Chest pain, cough, and sore throat for 3 weeks. EXAM: CHEST - 2 VIEW COMPARISON:  03/10/2017 FINDINGS: The cardiomediastinal silhouette is unchanged with normal heart size. Peribronchial thickening and interstitial coarsening are unchanged. No acute airspace consolidation, pleural effusion, pneumothorax is identified. No acute osseous abnormality is seen. IMPRESSION: Chronic bronchitic changes without evidence of active cardiopulmonary disease. Electronically Signed   By: Sebastian Ache M.D.   On: 09/06/2018 07:18    Procedures Procedures (including critical care time)  Medications Ordered in ED Medications - No data to display   Initial Impression / Assessment and Plan / ED Course  I have reviewed the triage vital signs and the nursing notes.  Pertinent imaging results that were available during my care of the patient were reviewed by me and considered in my medical decision making (see chart for details).       Normal VS, CXR clear. No indication for steroids or antibiotics currently. DDx includes post-nasal drip from allergies causing chronic cough, lingering cough from viral illness, cough 2/2 GERD. Not on ACE-I or other cough-inducing med. Recommended trial of claritin for a few weeks, f/u with a primary provider for reassessment. Consideration of H2 blocker if no relief in symptoms. Counseled on smoking cessation as this is likely exacerbating symptoms.   Final Clinical Impressions(s) / ED Diagnoses   Final diagnoses:  Cough  Sore throat and laryngitis    ED Discharge Orders         Ordered    loratadine (CLARITIN) 10 MG tablet  Daily     09/06/18 0739    guaiFENesin-dextromethorphan (ROBITUSSIN DM) 100-10 MG/5ML syrup  Every 6 hours PRN     09/06/18 0739           Kynlei Piontek, Ambrose Finland, MD 09/06/18 [phone]

## 2019-12-14 IMAGING — DX DG CHEST 2V
2 series · 2 of 2 positions shown · non-contrast
Comparison: 03/10/2017

CLINICAL DATA: Shortness of breath. Chest pain, cough, and sore
throat for 3 weeks.

EXAM:
CHEST - 2 VIEW

[chest pa]
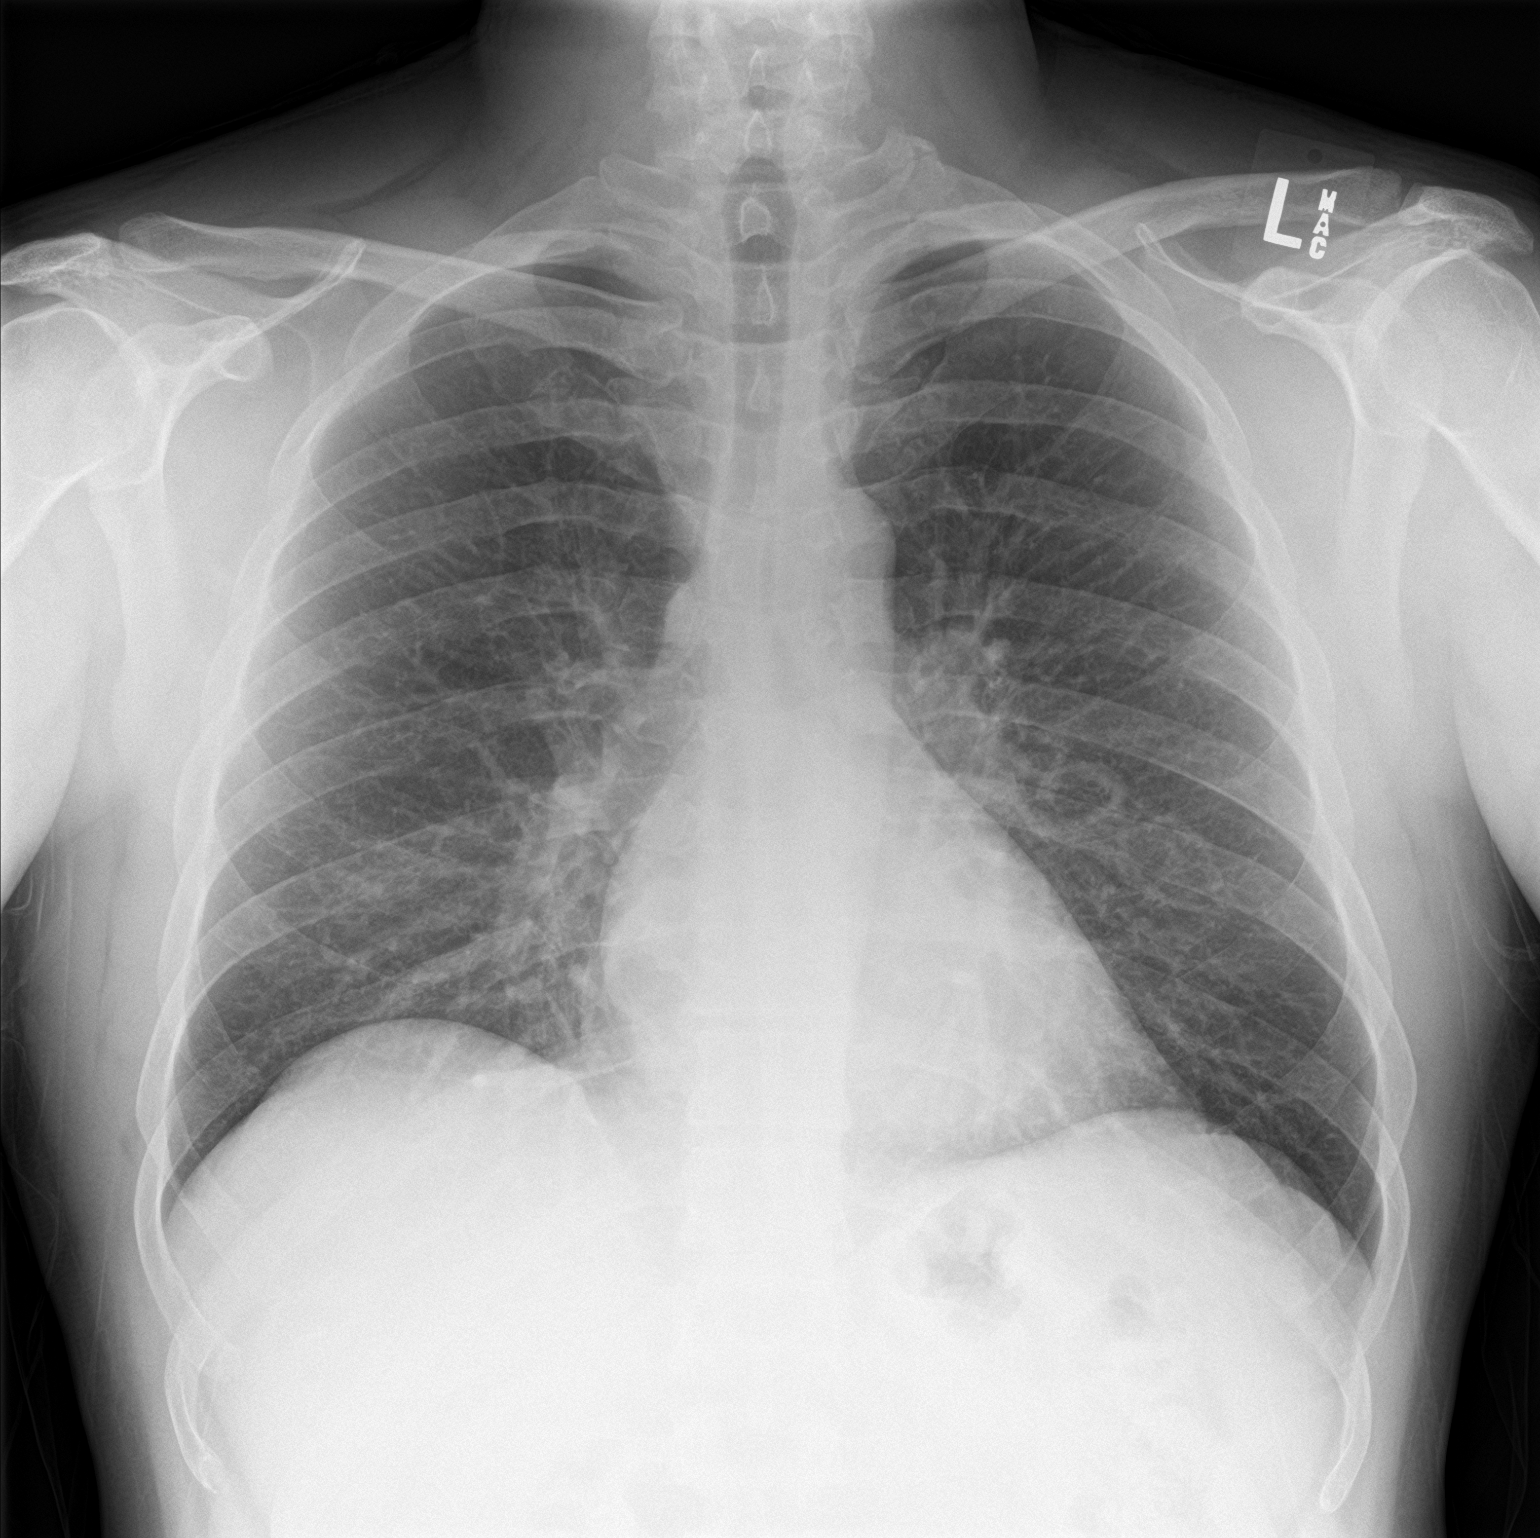

[chest lat]
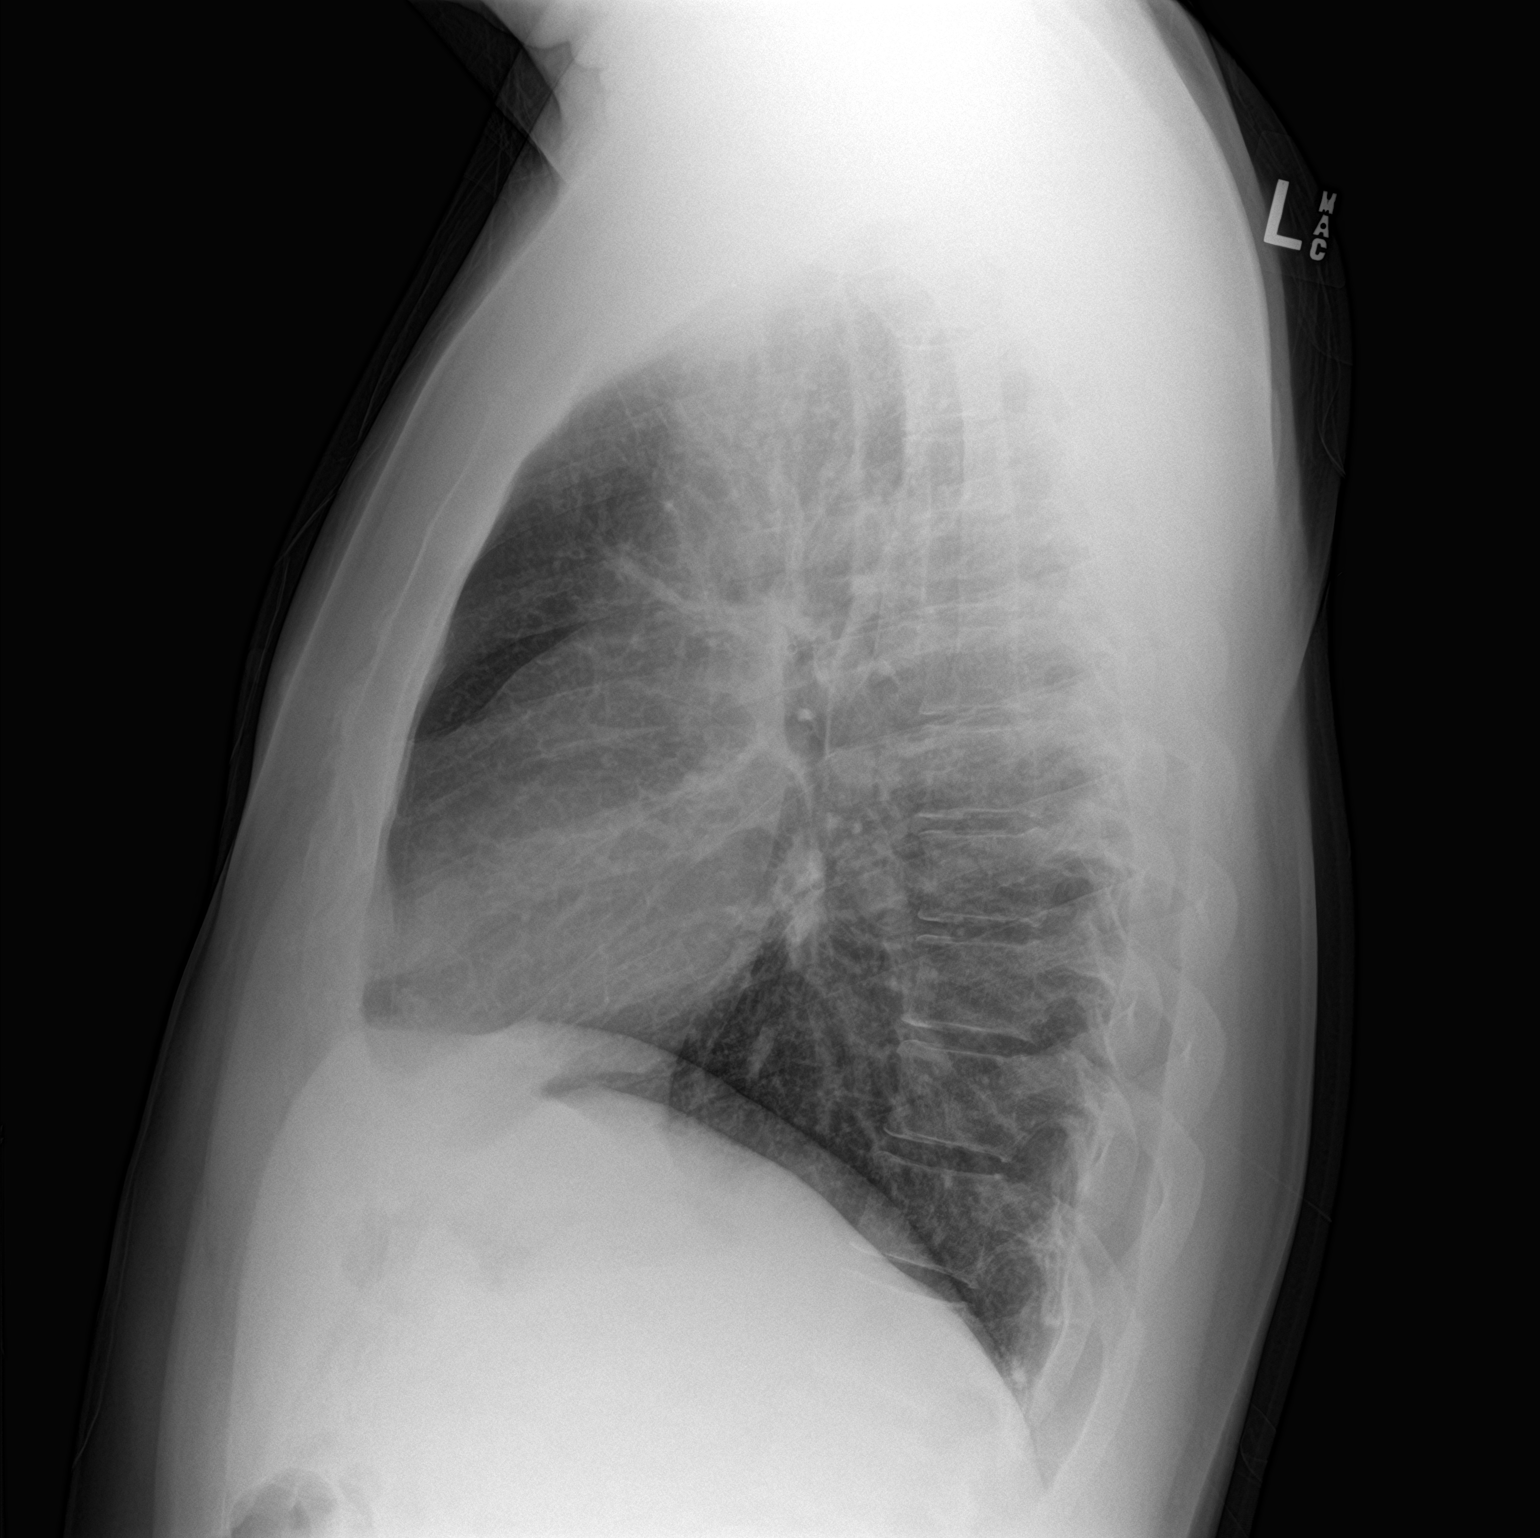

[2 of 2 positions shown; findings below may reference images not displayed]

FINDINGS: The cardiomediastinal silhouette is unchanged with normal heart
size. Peribronchial thickening and interstitial coarsening are
unchanged. No acute airspace consolidation, pleural effusion,
pneumothorax is identified. No acute osseous abnormality is seen.
IMPRESSION: Chronic bronchitic changes without evidence of active
cardiopulmonary disease.

## 2022-04-09 ENCOUNTER — Emergency Department (HOSPITAL_BASED_OUTPATIENT_CLINIC_OR_DEPARTMENT_OTHER): Payer: 59

## 2022-04-09 ENCOUNTER — Other Ambulatory Visit: Payer: Self-pay

## 2022-04-09 ENCOUNTER — Emergency Department (HOSPITAL_BASED_OUTPATIENT_CLINIC_OR_DEPARTMENT_OTHER)
Admission: EM | Admit: 2022-04-09 | Discharge: 2022-04-09 | Disposition: A | Discharge status: Home / Self Care | Payer: 59 | Attending: Emergency Medicine | Admitting: Emergency Medicine

## 2022-04-09 ENCOUNTER — Encounter (HOSPITAL_BASED_OUTPATIENT_CLINIC_OR_DEPARTMENT_OTHER): Payer: Self-pay | Admitting: Emergency Medicine

## 2022-04-09 DIAGNOSIS — M25512 Pain in left shoulder: Secondary | ICD-10-CM | POA: Insufficient documentation

## 2022-04-09 MED ORDER — DICLOFENAC SODIUM 1 % EX GEL: 4.0000 g | Freq: Four times a day (QID) | CUTANEOUS | 0 refills | Status: AC

## 2022-04-09 NOTE — ED Triage Notes (Signed)
Pt concerned for left shoulder dislocation. Pain started yesterday.

## 2022-04-09 NOTE — ED Provider Notes (Signed)
[facility] EMERGENCY DEPARTMENT Provider Note   CSN: [id number] Arrival date & time: 04/09/22  1744     History  Chief Complaint  Patient presents with   Shoulder Pain    Cameron Johns is a 46 y.o. male.   Shoulder Pain Patient is a 46 year old male presented emergency room today with chief complaint of "I think I dislocated my shoulder "he states that he woke up yesterday morning with left shoulder pain.  Which is gradually improved since that time.  He states that he thinks that he got it back into joint space.  He states that it has been achy and painful since.  He denies any numbness or weakness in his arm.  He states that he felt like it was somewhat swollen in his arm earlier today however this is now resolved.  He denies any chest pain difficulty breathing.  No recent surgeries, hospitalization, long travel, hemoptysis, estrogen containing OCP, cancer history.  No unilateral leg swelling.  No history of PE or VTE.      Home Medications Prior to Admission medications   Medication Sig Start Date End Date Taking? Authorizing Provider  diclofenac Sodium (VOLTAREN) 1 % GEL Apply 4 g topically 4 (four) times daily. 04/09/22  Yes Normand Damron S, PA  cyclobenzaprine (FLEXERIL) 10 MG tablet Take 1 tablet (10 mg total) by mouth 2 (two) times daily as needed for muscle spasms. 08/17/17   Tegeler, Gwenyth Allegra, MD  guaiFENesin-dextromethorphan (ROBITUSSIN DM) 100-10 MG/5ML syrup Take 15 mLs by mouth every 6 (six) hours as needed for cough. 09/06/18   Little, Wenda Overland, MD  loratadine (CLARITIN) 10 MG tablet Take 1 tablet (10 mg total) by mouth daily. 09/06/18   Little, Wenda Overland, MD  oxyCODONE-acetaminophen (PERCOCET/ROXICET) 5-325 MG tablet Take 1 tablet by mouth every 4 (four) hours as needed for severe pain. 08/17/17   Tegeler, Gwenyth Allegra, MD  QUEtiapine Fumarate (SEROQUEL PO) Take by mouth.    [provider]      Allergies    Lactose intolerance  (gi)    Review of Systems   Review of Systems  Physical Exam Updated Vital Signs BP 138/88   Pulse 74   Temp 98.3 F (36.8 C) (Oral)   Resp 16   Ht 5\' 7"  (1.702 m)   Wt 90.7 kg   SpO2 97%   BMI 31.32 kg/m  Physical Exam Vitals and nursing note reviewed.  Constitutional:      General: He is not in acute distress.    Appearance: Normal appearance. He is not ill-appearing.  HENT:     Head: Normocephalic and atraumatic.  Eyes:     General: No scleral icterus.       Right eye: No discharge.        Left eye: No discharge.     Conjunctiva/sclera: Conjunctivae normal.  Pulmonary:     Effort: Pulmonary effort is normal.     Breath sounds: No stridor.  Musculoskeletal:     Comments: Discomfort with range of motion of left shoulder.  Bilateral radial artery pulses 3+ and symmetric.  Sensation intact bilateral upper extremities full range of motion at elbow wrist and bilateral grip strength 5/5.  Neurological:     Mental Status: He is alert and oriented to person, place, and time. Mental status is at baseline.     ED Results / Procedures / Treatments   Labs (all labs ordered are listed, but only abnormal results are displayed) Labs Reviewed -  No data to display  EKG None  Radiology DG Shoulder Left  Result Date: 04/09/2022 CLINICAL DATA:  Left shoulder pain rule out dislocation EXAM: LEFT SHOULDER - 2+ VIEW COMPARISON:  None Available. FINDINGS: There is no evidence of fracture or dislocation. There is no evidence of arthropathy or other focal bone abnormality. Soft tissues are unremarkable. IMPRESSION: Negative. Electronically Signed   By: Marlan Palau M.D.   On: 04/09/2022 18:41    Procedures Procedures    Medications Ordered in ED Medications - No data to display  ED Course/ Medical Decision Making/ A&P                           Medical Decision Making Amount and/or Complexity of Data Reviewed Radiology: ordered.   Patient is a 46 year old male presented  emergency room today with chief complaint of "I think I dislocated my shoulder "he states that he woke up yesterday morning with left shoulder pain.  Which is gradually improved since that time.  He states that he thinks that he got it back into joint space.  He states that it has been achy and painful since.  He denies any numbness or weakness in his arm.  He states that he felt like it was somewhat swollen in his arm earlier today however this is now resolved.  He denies any chest pain difficulty breathing.  No recent surgeries, hospitalization, long travel, hemoptysis, estrogen containing OCP, cancer history.  No unilateral leg swelling.  No history of PE or VTE.   I personally viewed x-ray of left shoulder I do not appreciate any abnormal findings. X-ray negative for fracture or dislocation per radiology account  Patient is distally neurovascularly intact.  Recommend Voltaren gel, Tylenol, ibuprofen.  Recommend return precautions if symptoms do not gradually improve.  Follow-up with orthopedics.  I have low suspicion for upper extremity DVT.  He has no significant risk factors for this.  Normal vital signs and no chest pain or shortness of breath which is reassuring.  We will follow-up with orthopedics.  Return precautions discussed  Final Clinical Impression(s) / ED Diagnoses Final diagnoses:  Acute pain of left shoulder    Rx / DC Orders ED Discharge Orders          Ordered    diclofenac Sodium (VOLTAREN) 1 % GEL  4 times daily        04/09/22 2047              Gailen Shelter, [place] 04/09/22 2226    Charlynne Pander, MD 04/09/22 [phone]

## 2022-04-09 NOTE — Discharge Instructions (Signed)
Follow-up with a primary care provider.  If you notice any swelling in your arm or any numbness, weakness or worsening symptoms or other than improving symptoms return to the emergency room for ultrasound of your left upper extremity.  Otherwise Voltaren gel, Tylenol ibuprofen as discussed below and follow-up with sports medicine.  Please use Tylenol or ibuprofen for pain.  You may use 600 mg ibuprofen every 6 hours or 1000 mg of Tylenol every 6 hours.  You may choose to alternate between the 2.  This would be most effective.  Not to exceed 4 g of Tylenol within 24 hours.  Not to exceed 3200 mg ibuprofen 24 hours.
